# Patient Record
Sex: Female | Born: 1965
Health system: Southern US, Community
[De-identification: ages and names within clinical notes are randomized; demographics above are authoritative.]

---

## 2005-03-08 ENCOUNTER — Ambulatory Visit: Payer: Self-pay

## 2005-03-17 ENCOUNTER — Ambulatory Visit: Payer: Self-pay

## 2008-01-23 ENCOUNTER — Ambulatory Visit: Payer: Self-pay

## 2018-09-13 NOTE — Progress Notes (Signed)
Patient, No Pcp Per   Chief Complaint  Patient presents with  . Metrorrhagia    sometimes goes 2 months without it, this last time started period before fathers day in June and has not stopped bleeding since then, lots of clotting , no abnormal pain    HPI:      Ms. Cassie Cervantes is a 53 y.o. No obstetric history on file. who LMP was Patient's last menstrual period was 07/22/2018 (approximate)., presents today for NP menstrual issues of AUB since 6/20. Pt usually with menses Q1-3 months, lasting 4-7 days, light to mod flow, no clots, rare dysmen, no BTB. Feels hot at night sometimes but no sweating or hot flashes. Bleeding started 6/20 after 2-3 months of no menses. Has been daily since, mod to heavy flow, with small to larger clots, still without dysmen/pelvic pain. Flow has decreased some in last couple of days. Pt is sex active, no dyspareunia/postcoital bleeding. No vag sx. No recent labs done. Pt has not had recent pap/annual/mammo. No hx of abn paps. No FH breast/ovar/uterine/colon cancer.  No prior colonoscopy.  No med hx.   History reviewed. No pertinent past medical history.  History reviewed. No pertinent surgical history.  Family History  Problem Relation Age of Onset  . Lung disease Mother   . Heart disease Mother   . Lung cancer Father [id number]    Social History   Socioeconomic History  . Marital status: Married    Spouse name: Not on file  . Number of children: Not on file  . Years of education: Not on file  . Highest education level: Not on file  Occupational History  . Not on file  Social Needs  . Financial resource strain: Not on file  . Food insecurity    Worry: Not on file    Inability: Not on file  . Transportation needs    Medical: Not on file    Non-medical: Not on file  Tobacco Use  . Smoking status: Current Every Day Smoker  . Smokeless tobacco: Never Used  Substance and Sexual Activity  . Alcohol use: Yes  . Drug use: Not Currently  . Sexual  activity: Not Currently  Lifestyle  . Physical activity    Days per week: Not on file    Minutes per session: Not on file  . Stress: Not on file  Relationships  . Social Musicianconnections    Talks on phone: Not on file    Gets together: Not on file    Attends religious service: Not on file    Active member of club or organization: Not on file    Attends meetings of clubs or organizations: Not on file    Relationship status: Not on file  . Intimate partner violence    Fear of current or ex partner: Not on file    Emotionally abused: Not on file    Physically abused: Not on file    Forced sexual activity: Not on file  Other Topics Concern  . Not on file  Social History Narrative  . Not on file    No outpatient medications prior to visit.   No facility-administered medications prior to visit.       ROS:  Review of Systems  Constitutional: Positive for fatigue. Negative for fever and unexpected weight change.  Respiratory: Negative for cough, shortness of breath and wheezing.   Cardiovascular: Negative for chest pain, palpitations and leg swelling.  Gastrointestinal: Negative for blood in stool, constipation,  diarrhea, nausea and vomiting.  Endocrine: Negative for cold intolerance, heat intolerance and polyuria.  Genitourinary: Positive for menstrual problem. Negative for dyspareunia, dysuria, flank pain, frequency, genital sores, hematuria, pelvic pain, urgency, vaginal bleeding, vaginal discharge and vaginal pain.  Musculoskeletal: Negative for back pain, joint swelling and myalgias.  Skin: Negative for rash.  Neurological: Negative for dizziness, syncope, light-headedness, numbness and headaches.  Hematological: Negative for adenopathy.  Psychiatric/Behavioral: Negative for agitation, confusion, sleep disturbance and suicidal ideas. The patient is not nervous/anxious.   BREAST: No symptoms   OBJECTIVE:   Vitals:  BP 126/80   Ht 5\' 3"  (1.6 m)   Wt 174 lb (78.9 kg)   LMP  07/22/2018 (Approximate)   BMI 30.82 kg/m   Physical Exam Vitals signs reviewed.  Constitutional:      Appearance: She is well-developed.  Neck:     Musculoskeletal: Normal range of motion.  Pulmonary:     Effort: Pulmonary effort is normal.  Genitourinary:    Pubic Area: No rash.      Labia:        Right: Rash present. No tenderness or lesion.        Left: Rash present. No tenderness or lesion.      Vagina: Bleeding present. No vaginal discharge, erythema or tenderness.     Cervix: No friability, lesion or erythema.     Uterus: Normal. Not enlarged and not tender.      Adnexa: Right adnexa normal and left adnexa normal.       Right: No mass or tenderness.         Left: No mass or tenderness.       Comments: ERYTHEMA BILAT LABIA MAJORA (MOST LIKELY DUE TO RECENT BATHING SUIT); NO ITCH/IRRITATION TO PT Musculoskeletal: Normal range of motion.  Skin:    General: Skin is warm and dry.  Neurological:     General: No focal deficit present.     Mental Status: She is alert and oriented to person, place, and time.  Psychiatric:        Mood and Affect: Mood normal.        Behavior: Behavior normal.        Thought Content: Thought content normal.        Judgment: Judgment normal.     Assessment/Plan: Abnormal uterine bleeding (AUB) - Plan: TSH + free T4, Prolactin, US PELVIS TRANSVAGINAL NON-OB (TV ONLY),  Since 6/20. Flow is lighter past few days. Check labs, pap, and u/s. Will f/u with results. Most likely perimenopausal. May need EMB and/or hormones.  Thyroid disorder screening - Plan: TSH + free T4,  Cervical cancer screening - Plan: Cytology - PAP,   Screening for HPV (human papillomavirus) - Plan: Cytology - PAP,     Return in about 1 day (around 09/17/2018) for GYN u/s for AUB--[facility] to call pt. Aletha Halim have pt to RTO for annual once this addressed  Alicia B. Copland, PA-C 09/16/2018 10:16 AM

## 2018-09-13 NOTE — Patient Instructions (Signed)
I value your feedback and entrusting us with your care. If you get a [facility] patient survey, I would appreciate you taking the time to let us know about your experience today. Thank you! 

## 2018-09-16 ENCOUNTER — Encounter: Payer: Self-pay | Admitting: Obstetrics and Gynecology

## 2018-09-16 ENCOUNTER — Other Ambulatory Visit: Payer: Self-pay

## 2018-09-16 ENCOUNTER — Other Ambulatory Visit (HOSPITAL_COMMUNITY)
Admission: RE | Admit: 2018-09-16 | Discharge: 2018-09-16 | Disposition: A | Discharge status: Home / Self Care | Payer: Commercial Managed Care - PPO | Source: Ambulatory Visit | Attending: Obstetrics and Gynecology | Admitting: Obstetrics and Gynecology

## 2018-09-16 ENCOUNTER — Ambulatory Visit (INDEPENDENT_AMBULATORY_CARE_PROVIDER_SITE_OTHER): Payer: Commercial Managed Care - PPO | Admitting: Obstetrics and Gynecology

## 2018-09-16 VITALS — BP 126/80 | Ht 63.0 in | Wt 174.0 lb

## 2018-09-16 DIAGNOSIS — Z1329 Encounter for screening for other suspected endocrine disorder: Secondary | ICD-10-CM

## 2018-09-16 DIAGNOSIS — Z1151 Encounter for screening for human papillomavirus (HPV): Secondary | ICD-10-CM | POA: Insufficient documentation

## 2018-09-16 DIAGNOSIS — N939 Abnormal uterine and vaginal bleeding, unspecified: Secondary | ICD-10-CM | POA: Diagnosis not present

## 2018-09-16 DIAGNOSIS — Z124 Encounter for screening for malignant neoplasm of cervix: Secondary | ICD-10-CM | POA: Diagnosis not present

## 2018-09-17 ENCOUNTER — Other Ambulatory Visit: Payer: Commercial Managed Care - PPO

## 2018-09-17 LAB — PROLACTIN: Prolactin: 7.1 ng/mL (ref 4.8–23.3)

## 2018-09-17 LAB — TSH+FREE T4
Free T4: 1.13 ng/dL (ref 0.82–1.77)
TSH: 3.38 u[IU]/mL (ref 0.450–4.500)

## 2018-09-17 NOTE — Progress Notes (Signed)
Pls let pt know labs normal. Will wait for u/s results.

## 2018-09-17 NOTE — Progress Notes (Signed)
Pt aware.

## 2018-09-18 LAB — CYTOLOGY - PAP
Diagnosis: NEGATIVE
HPV: NOT DETECTED

## 2018-09-20 ENCOUNTER — Ambulatory Visit (INDEPENDENT_AMBULATORY_CARE_PROVIDER_SITE_OTHER): Payer: Commercial Managed Care - PPO

## 2018-09-20 ENCOUNTER — Other Ambulatory Visit: Payer: Self-pay

## 2018-09-20 DIAGNOSIS — N939 Abnormal uterine and vaginal bleeding, unspecified: Secondary | ICD-10-CM

## 2018-09-23 ENCOUNTER — Telehealth: Payer: Self-pay | Admitting: Obstetrics and Gynecology

## 2018-09-23 DIAGNOSIS — N83201 Unspecified ovarian cyst, right side: Secondary | ICD-10-CM

## 2018-09-23 NOTE — Telephone Encounter (Signed)
Pt aware of GYN u/s results. Was seen for AUB since 6/20. Bleeding has since stopped. Will follow cycles to see if sx persist and f/u for EMB if sx recur.  Pt aware of RTO complex cyst. No pelvic pain. RTO in 3 months for f/u u/s. Pt to call to sched.   ULTRASOUND REPORT  Location: [facility]  Date of Service: 09/20/2018     Indications:Abnormal Uterine Bleeding Findings:  The uterus is anteverted and measures 9.1 x 6.2 x 4.8 cm. Echo texture is heterogenous without evidence of focal masses.  The Endometrium measures 5.7 mm. The endometrium is not clearly visualized.   Right Ovary measures 2.6 x 1.6 x 1.7 cm. It is not normal in appearance. There is a complex cyst in the right ovary, no blood flow. It measures 16 x 12 x 10 mm.  Left Ovary measures 2.4 x 1.5 x 1.2  cm. It is normal in appearance. Survey of the adnexa demonstrates no adnexal masses. There is no free fluid in the cul de sac.  Impression: 1. The uterus is heterogeneous.  2. The endometrium is not clearly visualized.  3. There is a small, complex cyst in the right ovary. No blood flow is seen within.  4. Normal right ovary.   Recommendations: 1.Clinical correlation with the patient's History and Physical Exam.  Gweneth Dimitri, RT   Review of ULTRASOUND.    I have personally reviewed images and report of recent ultrasound done at [facility].    Plan of management to be discussed with patient.  Barnett Applebaum, MD, [facility], [facility] 09/21/2018  2:08 PM

## 2018-10-24 ENCOUNTER — Other Ambulatory Visit: Payer: Self-pay | Admitting: Physician Assistant

## 2018-10-24 DIAGNOSIS — Z1231 Encounter for screening mammogram for malignant neoplasm of breast: Secondary | ICD-10-CM

## 2018-11-06 ENCOUNTER — Ambulatory Visit
Admission: RE | Admit: 2018-11-06 | Discharge: 2018-11-06 | Disposition: A | Discharge status: Home / Self Care | Payer: Commercial Managed Care - PPO | Source: Ambulatory Visit | Attending: Physician Assistant | Admitting: Physician Assistant

## 2018-11-06 ENCOUNTER — Other Ambulatory Visit: Payer: Self-pay

## 2018-11-06 DIAGNOSIS — Z1231 Encounter for screening mammogram for malignant neoplasm of breast: Secondary | ICD-10-CM | POA: Diagnosis present

## 2018-11-29 ENCOUNTER — Other Ambulatory Visit: Payer: Self-pay | Admitting: Physician Assistant

## 2018-11-29 ENCOUNTER — Other Ambulatory Visit (HOSPITAL_COMMUNITY): Payer: Self-pay | Admitting: Physician Assistant

## 2018-11-29 DIAGNOSIS — R222 Localized swelling, mass and lump, trunk: Secondary | ICD-10-CM

## 2018-12-10 ENCOUNTER — Other Ambulatory Visit: Payer: Self-pay

## 2018-12-10 ENCOUNTER — Ambulatory Visit
Admission: RE | Admit: 2018-12-10 | Discharge: 2018-12-10 | Disposition: A | Discharge status: Home / Self Care | Payer: Commercial Managed Care - PPO | Source: Ambulatory Visit | Attending: Physician Assistant | Admitting: Physician Assistant

## 2018-12-10 DIAGNOSIS — R222 Localized swelling, mass and lump, trunk: Secondary | ICD-10-CM | POA: Insufficient documentation

## 2021-05-07 IMAGING — MG MM DIGITAL SCREENING BILAT W/ TOMO W/ CAD
8 series · 8 of 24 positions shown · non-contrast
Comparison: Previous exam(s).

CLINICAL DATA: Screening.

EXAM:
DIGITAL SCREENING BILATERAL MAMMOGRAM WITH TOMO AND CAD

[R CC synth-2D]
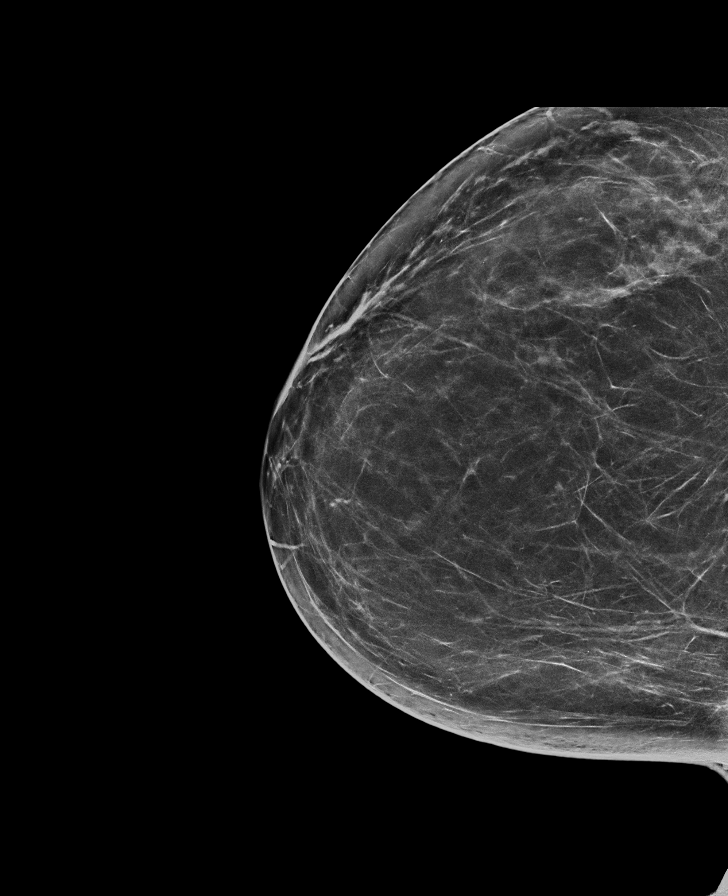

[L CC synth-2D]
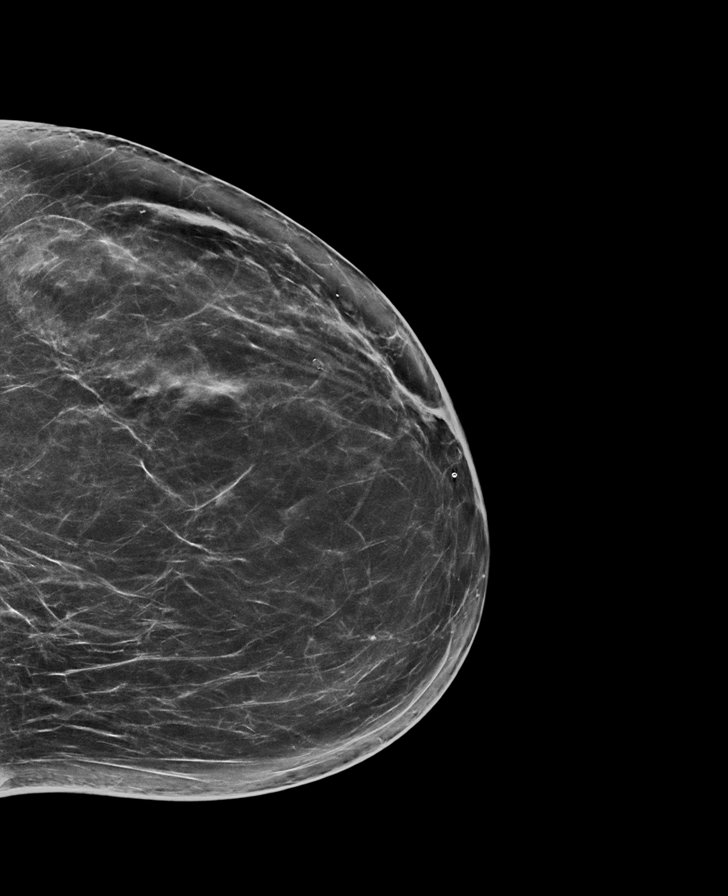

[L MLO synth-2D]
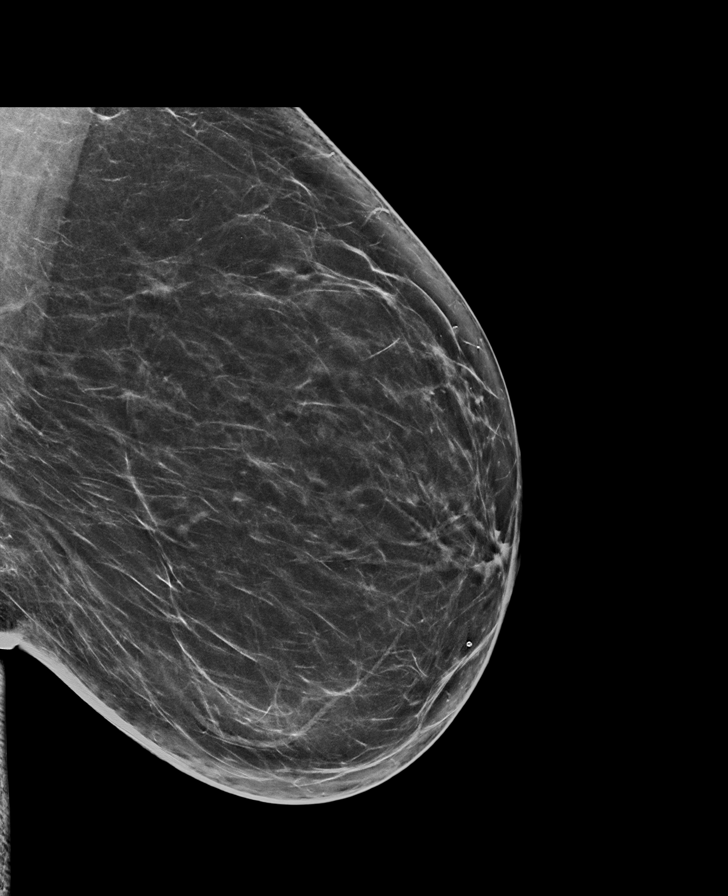

[R MLO synth-2D]
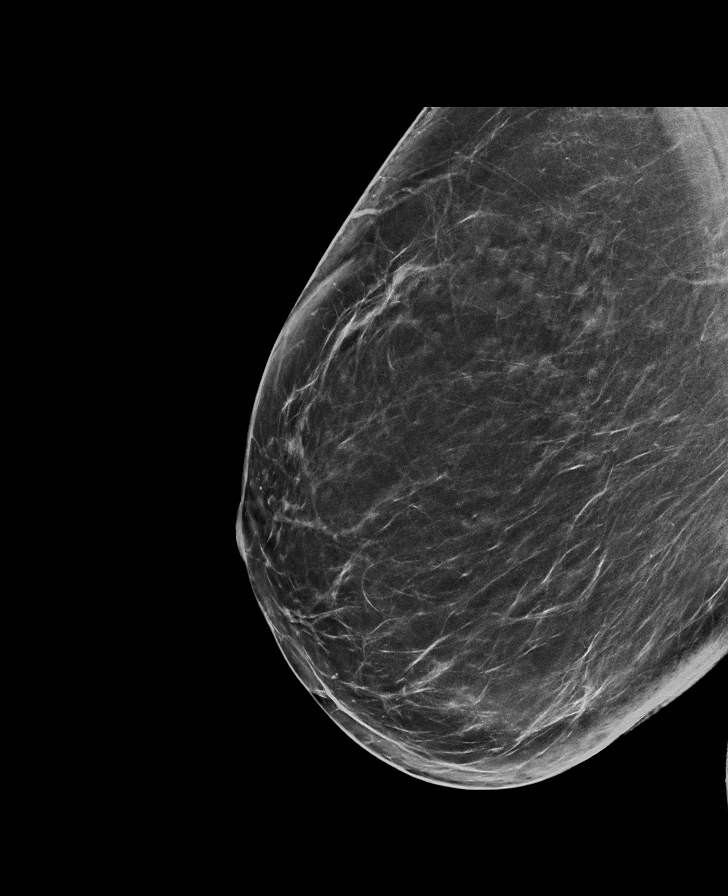

[R CC tomo · tomo slice 40/79.0]
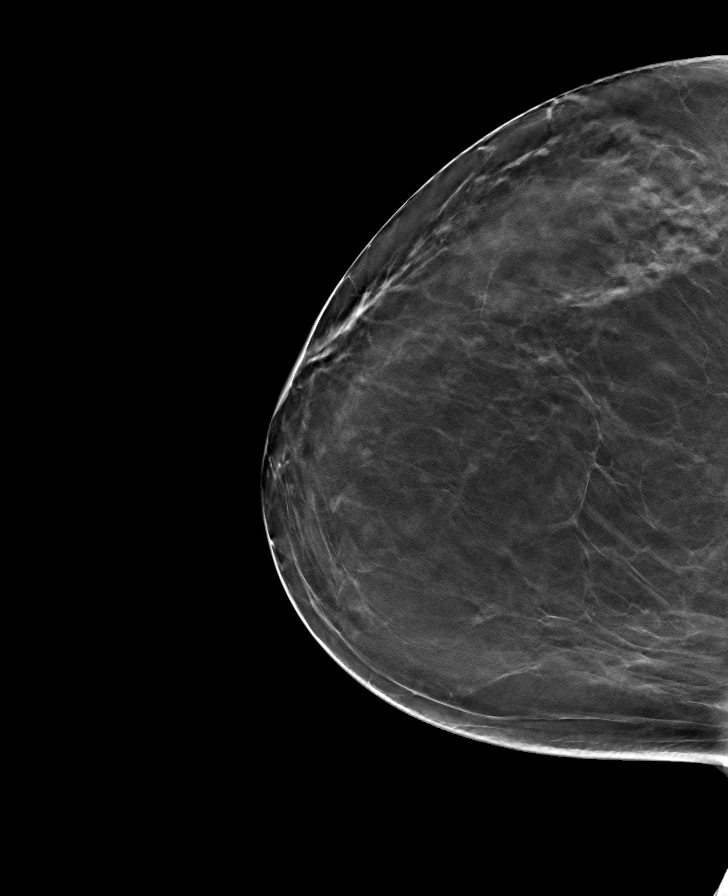

[R MLO tomo · tomo slice 43/84.0]
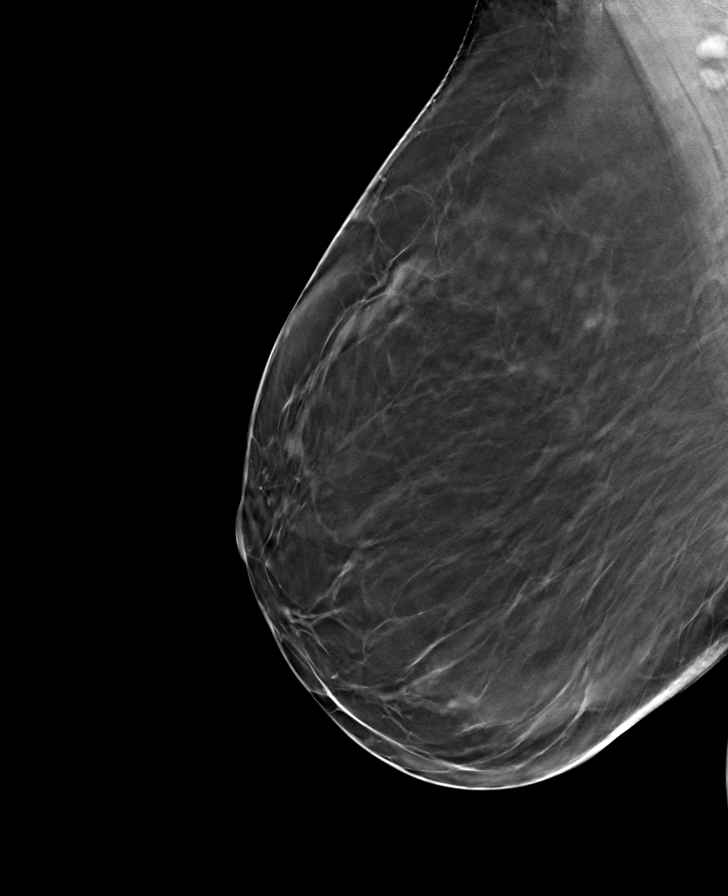

[L MLO tomo · tomo slice 43/85.0]
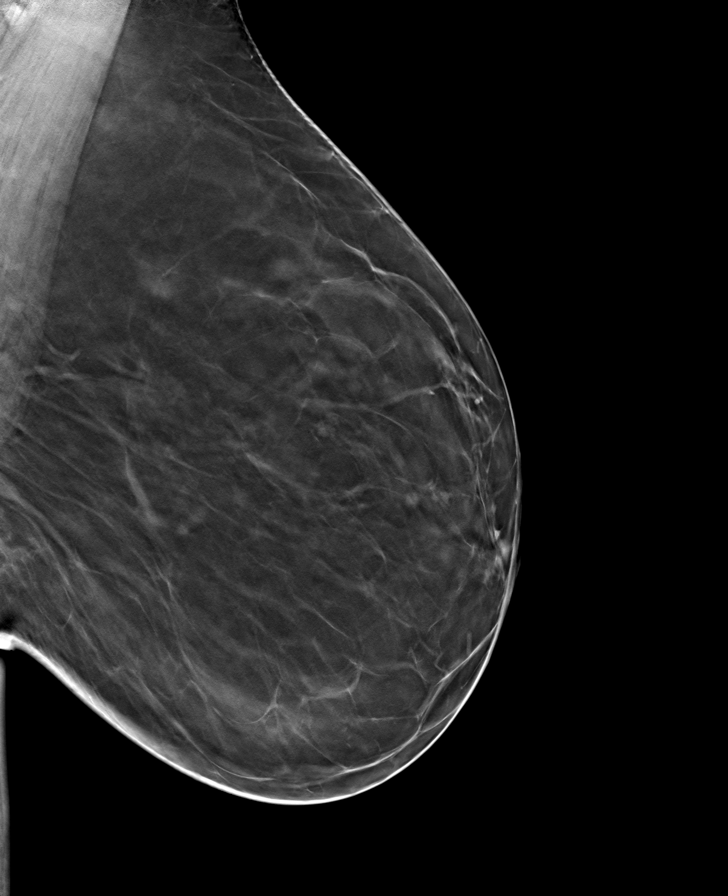

[L CC tomo · tomo slice 41/82.0]
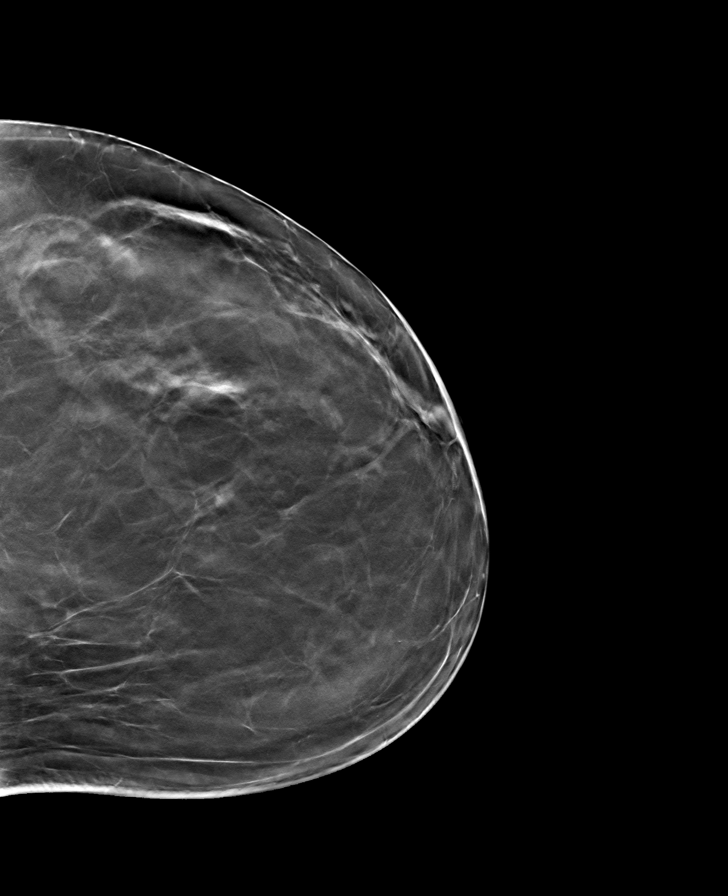

[8 of 24 positions shown; findings below may reference images not displayed]

ACR Breast Density Category b: There are scattered areas of
fibroglandular density.
FINDINGS: There are no findings suspicious for malignancy. Images were
processed with CAD.
IMPRESSION: No mammographic evidence of malignancy. A result letter of this
screening mammogram will be mailed directly to the patient.

RECOMMENDATION:
Screening mammogram in one year. (Code:[id number])

BI-RADS CATEGORY  1: Negative.

## 2021-06-10 IMAGING — CT CT L SPINE W/O CM
3 of 4 series · 12 of 33 positions shown, 14 images · non-contrast
Comparison: None.

CLINICAL DATA: Low back pain and palpable mass of the back.

EXAM:
CT LUMBAR SPINE WITHOUT CONTRAST
TECHNIQUE: Multidetector CT imaging of the lumbar spine was performed without
intravenous contrast administration. Multiplanar CT image
reconstructions were also generated.

[Series 2: lspine st l-spine 2.00 · axial · 0.38mm/px · z∈[-1345,-1169]mm · 4 of 134 slices shown, 5 images]
[im 23/134  soft-tissue]
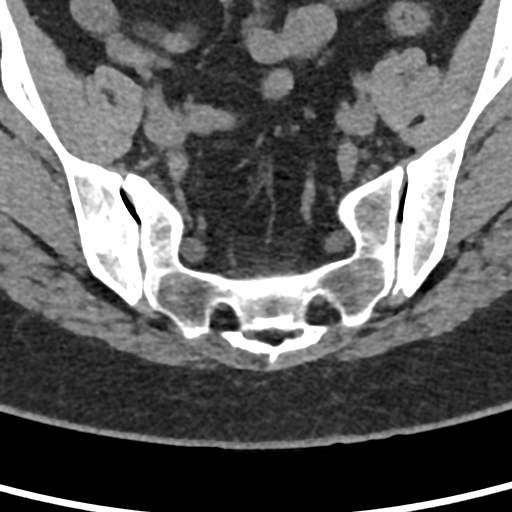
[im 23/134  bone]
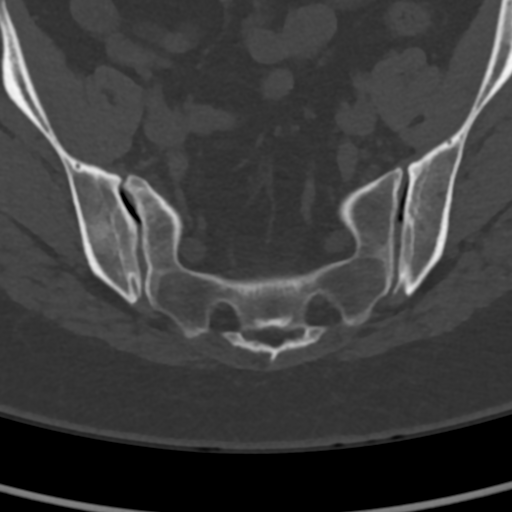
[im 45/134  bone]
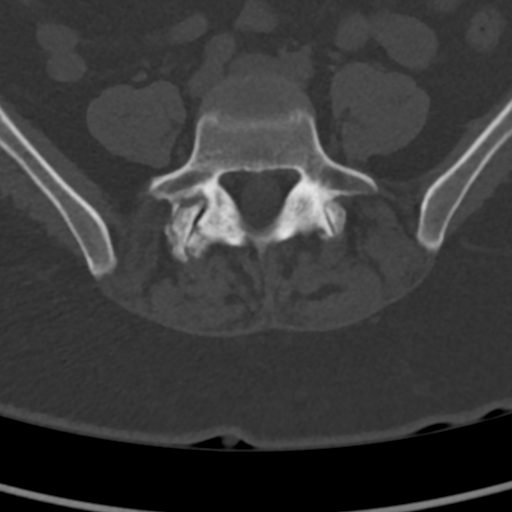
[im 89/134  bone]
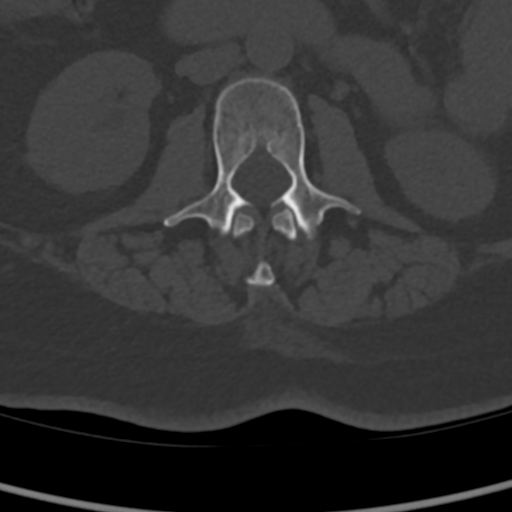
[im 111/134  bone]
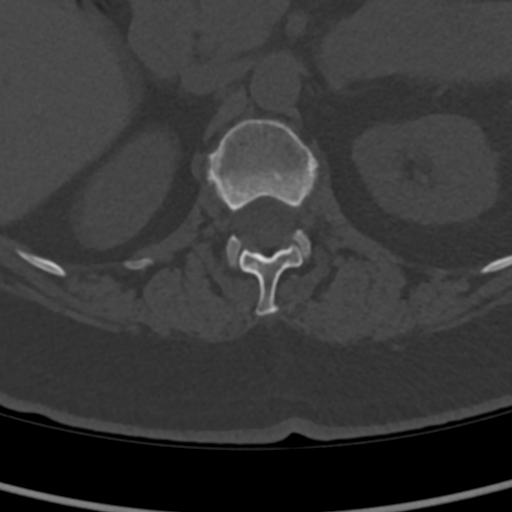

[Series 4: sag bone l-spine 2.00 sag · sagittal · 0.35mm/px · 5 of 96 slices shown, 6 images]
[im 32/96  bone]
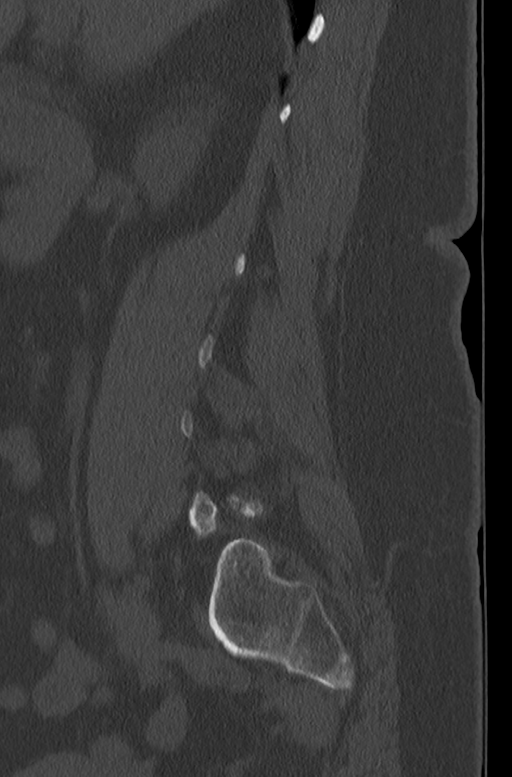
[im 40/96  bone]
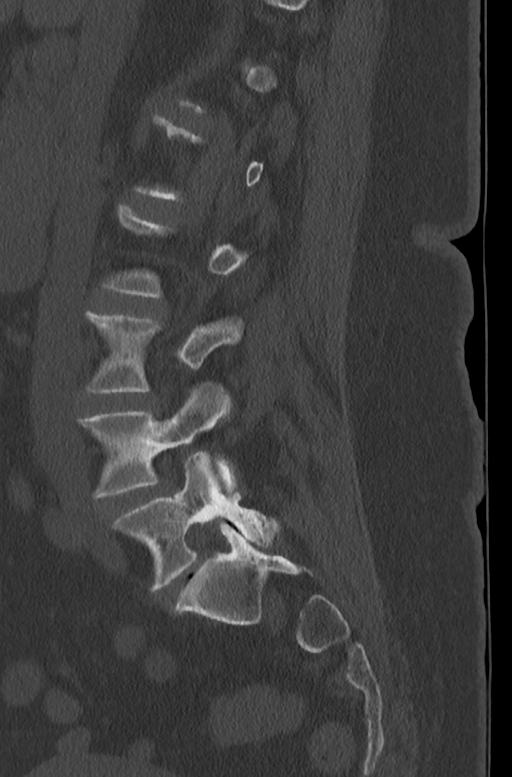
[im 48/96  soft-tissue]
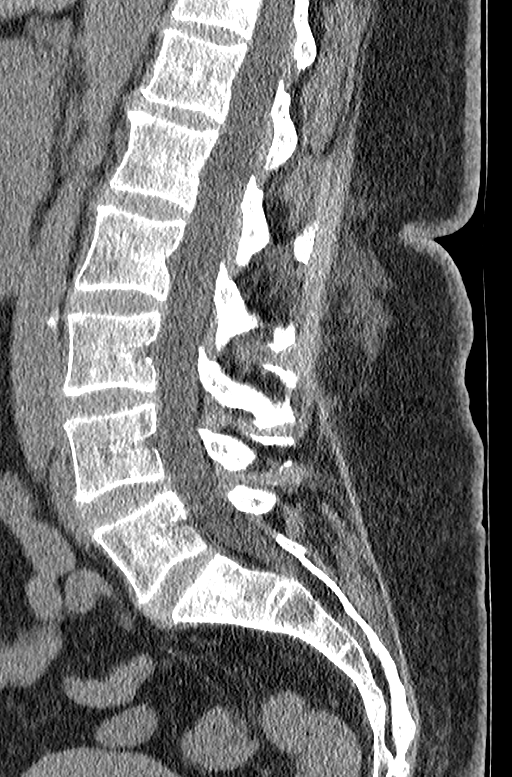
[im 48/96  bone]
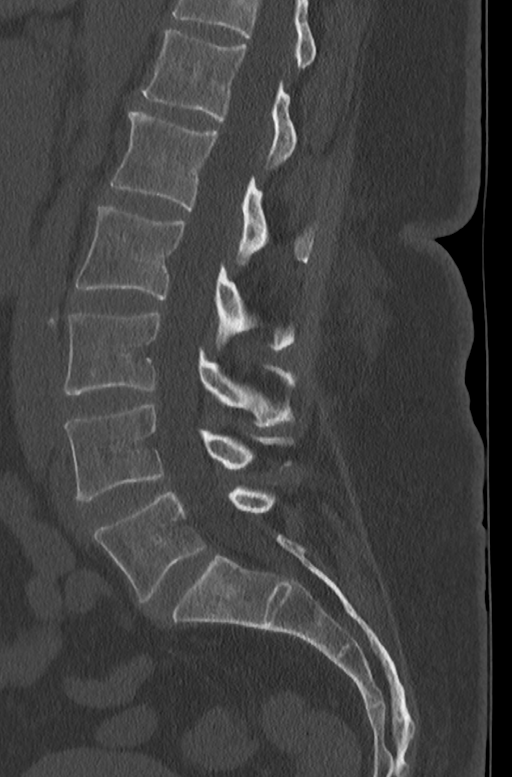
[im 56/96  bone]
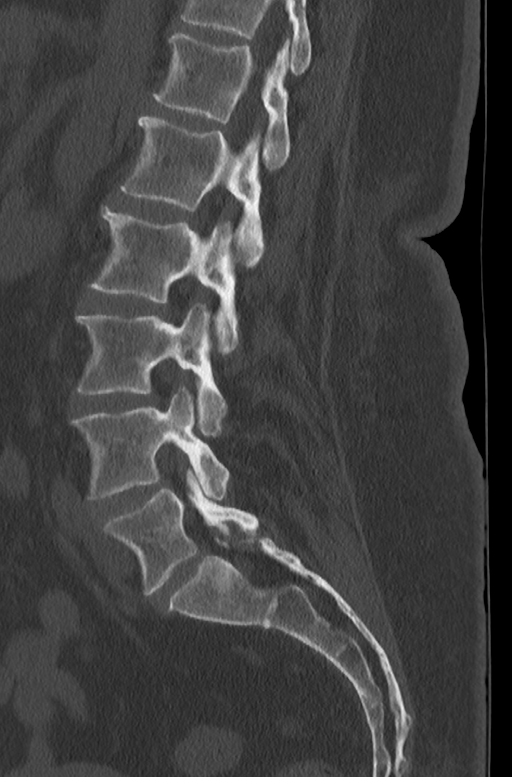
[im 64/96  bone]
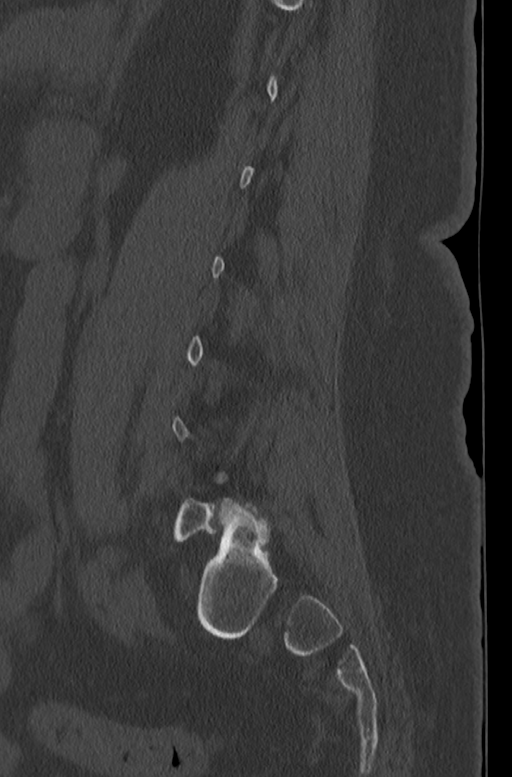

[Series 6: cor bone l-spine 2.00 cor · coronal · 0.38mm/px · 3 of 87 slices shown]
[im 18/87  bone]
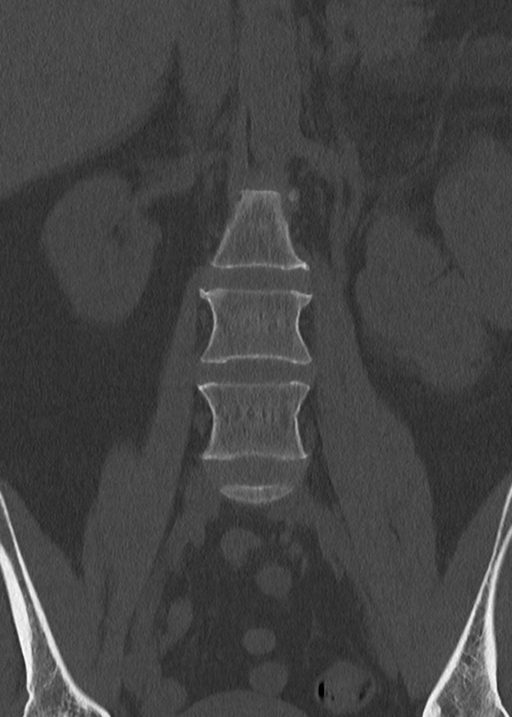
[im 35/87  bone]
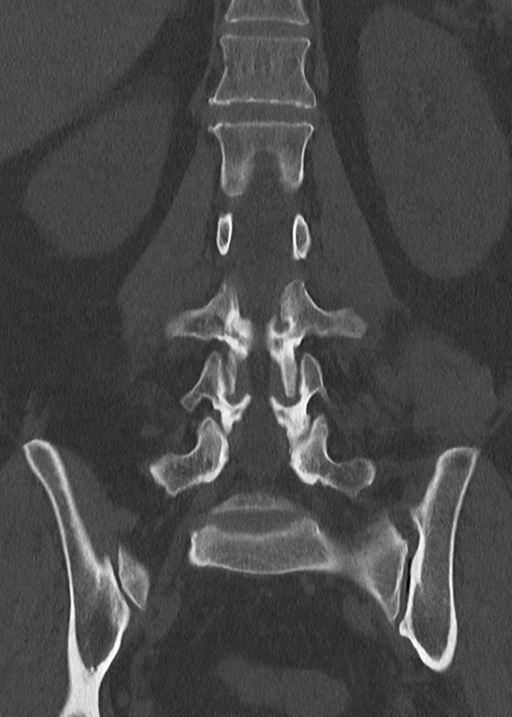
[im 52/87  bone]
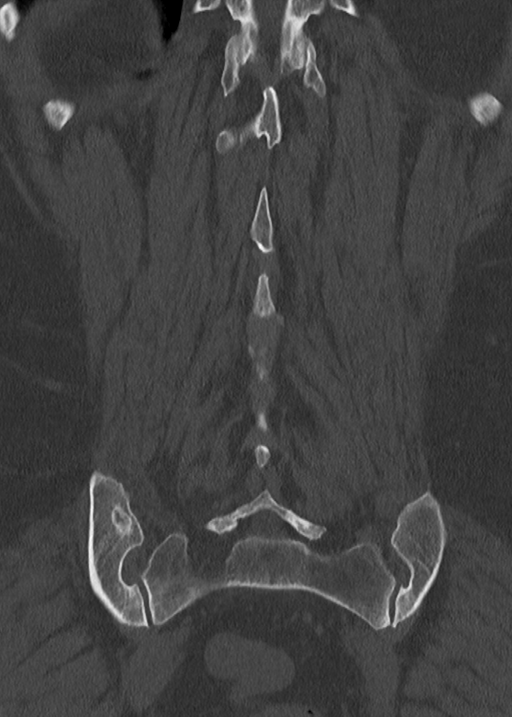

[12 of 33 positions shown; findings below may reference images not displayed]

FINDINGS: Segmentation: 5 lumbar type vertebrae.

Alignment: Normal.

Vertebrae: There is no fracture or mass lesion of the spine. There
is slight bilateral facet arthritis at L4-5. There is severe right
and moderate left facet arthritis at L5-S1.

Paraspinal and other soft tissues: There are few diverticula in the
distal colon. Slight aortic atherosclerosis. The patient reports a
palpable soft tissue mass in the lower aspect of the back just to
the right of midline at L5. There is no definable soft tissue mass,
cyst, or other significant abnormality in that area. The
subcutaneous fat appears slightly more prominent in that area.

Disc levels: The discs from T10-11 through L5-S1 are normal. No disc
bulging or protrusion. No disc space narrowing. No spinal or
foraminal stenosis.
IMPRESSION: 1. No significant abnormality of the lumbar spine. The patient
reports a palpable soft tissue mass in the lower aspect of the back
just to the right of midline at L5 but in the area of concern is
only prominent but otherwise normal subcutaneous fat.
2. Severe right facet arthritis at L5-S1.
3. Slight aortic atherosclerosis.

Aortic Atherosclerosis (BD3ES-AR9.9).

## 2023-01-17 ENCOUNTER — Ambulatory Visit
Admission: EM | Admit: 2023-01-17 | Discharge: 2023-01-17 | Disposition: A | Discharge status: Home / Self Care | Payer: Commercial Managed Care - PPO | Attending: Internal Medicine | Admitting: Internal Medicine

## 2023-01-17 DIAGNOSIS — H66002 Acute suppurative otitis media without spontaneous rupture of ear drum, left ear: Secondary | ICD-10-CM | POA: Diagnosis not present

## 2023-01-17 DIAGNOSIS — H6993 Unspecified Eustachian tube disorder, bilateral: Secondary | ICD-10-CM | POA: Diagnosis not present

## 2023-01-17 DIAGNOSIS — H9312 Tinnitus, left ear: Secondary | ICD-10-CM

## 2023-01-17 MED ORDER — AMOXICILLIN 875 MG PO TABS: 875.0000 mg | ORAL_TABLET | Freq: Two times a day (BID) | ORAL | 0 refills | Status: AC

## 2023-01-17 MED ORDER — PREDNISONE 20 MG PO TABS: 40.0000 mg | ORAL_TABLET | Freq: Every day | ORAL | 0 refills | Status: AC

## 2023-01-17 MED ORDER — FLUTICASONE PROPIONATE 50 MCG/ACT NA SUSP: 1.0000 | Freq: Every day | NASAL | 0 refills | Status: AC

## 2023-01-17 NOTE — ED Triage Notes (Addendum)
Patient states that she started with a sore throat Tuesday. She blew her nose Wednesday and her left ear popped. She still has a bussing noise in her ear. Chest congestion. Patient states that she feels that she had Covid because she lost her taste and smell that she has started getting back today. Throat is no longer sore. She just has mucus and her left ear popping.

## 2023-01-17 NOTE — ED Provider Notes (Signed)
[facility]    CSN: [id number] Arrival date & time: 01/17/23  1717      History   Chief Complaint Chief Complaint  Patient presents with   Ear Fullness   mucus    HPI Cassie Cervantes is a 57 y.o. female presents for ear pain.  Patient reports last week she developed some cough congestion and sore throat.  She reports a week ago today she blew her nose and she had a large pop in her left ear and since then has been having diminished hearing, fullness, and tinnitus.  She reports some residual nasal congestion but states her cough is better and she denies any fevers.  No asthma history.  She is an active smoker.  Denies any body aches, shortness of breath.  She has been taking Afrin OTC for symptoms.  No other concerns at this time   Ear Fullness    History reviewed. No pertinent past medical history.  There are no problems to display for this patient.   History reviewed. No pertinent surgical history.  OB History     Gravida  3   Para  2   Term  2   Preterm      AB  1   Living  2      SAB      IAB      Ectopic      Multiple      Live Births  2            Home Medications    Prior to Admission medications   Medication Sig Start Date End Date Taking? Authorizing Provider  amoxicillin (AMOXIL) 875 MG tablet Take 1 tablet (875 mg total) by mouth 2 (two) times daily for 10 days. 01/17/23 01/27/23 Yes Radford Pax, NP  fluticasone (FLONASE) 50 MCG/ACT nasal spray Place 1 spray into both nostrils daily. 01/17/23  Yes Radford Pax, NP  predniSONE (DELTASONE) 20 MG tablet Take 2 tablets (40 mg total) by mouth daily with breakfast for 5 days. 01/18/23 01/23/23 Yes Radford Pax, NP    Family History Family History  Problem Relation Age of Onset   Lung disease Mother    Heart disease Mother    Lung cancer Father 61   Breast cancer Neg Hx     Social History Social History   Tobacco Use   Smoking status: Every Day   Smokeless  tobacco: Never  Vaping Use   Vaping status: Never Used  Substance Use Topics   Alcohol use: Yes   Drug use: Not Currently     Allergies   Patient has no known allergies.   Review of Systems Review of Systems  HENT:  Positive for congestion, ear pain and tinnitus.      Physical Exam Triage Vital Signs ED Triage Vitals  Encounter Vitals Group     BP 01/17/23 1757 (!) 187/96     Systolic BP Percentile --      Diastolic BP Percentile --      Pulse Rate 01/17/23 1757 68     Resp 01/17/23 1757 19     Temp 01/17/23 1757 98.2 F (36.8 C)     Temp Source 01/17/23 1757 Oral     SpO2 01/17/23 1757 94 %     Weight --      Height --      Head Circumference --      Peak Flow --      Pain Score 01/17/23  1756 0     Pain Loc --      Pain Education --      Exclude from Growth Chart --    No data found.  Updated Vital Signs BP (!) 187/96 (BP Location: Right Arm)   Pulse 68   Temp 98.2 F (36.8 C) (Oral)   Resp 19   LMP 07/08/2018 (Approximate)   SpO2 94%   Visual Acuity Right Eye Distance:   Left Eye Distance:   Bilateral Distance:    Right Eye Near:   Left Eye Near:    Bilateral Near:     Physical Exam Vitals and nursing note reviewed.  Constitutional:      General: She is not in acute distress.    Appearance: She is well-developed. She is not ill-appearing.  HENT:     Head: Normocephalic and atraumatic.     Right Ear: Ear canal normal. A middle ear effusion is present.     Left Ear: Ear canal normal. Tympanic membrane is erythematous. Tympanic membrane is not perforated.     Nose: Congestion present.     Mouth/Throat:     Mouth: Mucous membranes are moist.     Pharynx: Oropharynx is clear. Uvula midline. No oropharyngeal exudate or posterior oropharyngeal erythema.     Tonsils: No tonsillar exudate or tonsillar abscesses.  Eyes:     Conjunctiva/sclera: Conjunctivae normal.     Pupils: Pupils are equal, round, and reactive to light.  Cardiovascular:      Rate and Rhythm: Normal rate and regular rhythm.     Heart sounds: Normal heart sounds.  Pulmonary:     Effort: Pulmonary effort is normal.     Breath sounds: Normal breath sounds.  Musculoskeletal:     Cervical back: Normal range of motion and neck supple.  Lymphadenopathy:     Cervical: No cervical adenopathy.  Skin:    General: Skin is warm and dry.  Neurological:     General: No focal deficit present.     Mental Status: She is alert and oriented to person, place, and time.  Psychiatric:        Mood and Affect: Mood normal.        Behavior: Behavior normal.      UC Treatments / Results  Labs (all labs ordered are listed, but only abnormal results are displayed) Labs Reviewed - No data to display  EKG   Radiology No results found.  Procedures Procedures (including critical care time)  Medications Ordered in UC Medications - No data to display  Initial Impression / Assessment and Plan / UC Course  I have reviewed the triage vital signs and the nursing notes.  Pertinent labs & imaging results that were available during my care of the patient were reviewed by me and considered in my medical decision making (see chart for details).     Reviewed exam and symptoms with patient.  No red flags.  Will start amoxicillin for left OM.  Start Flonase and prednisone as well for eustachian tube dysfunction.  Discussed tinnitus likely secondary to this.  Advised PCP or ENT follow-up as symptoms do not improve.  She declined cough medicine stating her cough is better and declined chest x-ray at this time.  ER precautions reviewed. Final Clinical Impressions(s) / UC Diagnoses   Final diagnoses:  Non-recurrent acute suppurative otitis media of left ear without spontaneous rupture of tympanic membrane  Tinnitus of left ear  Eustachian tube dysfunction, bilateral     Discharge  Instructions      Start amoxicillin twice daily for 10 days.  Flonase daily.  Prednisone daily for 5  days to help with your eustachian tube dysfunction.  Lots of rest and fluids.  Please follow-up with your PCP if your symptoms do not improve.  Please go to the ER for any worsening symptoms.  Hope you feel better soon!    ED Prescriptions     Medication Sig Dispense Auth. Provider   amoxicillin (AMOXIL) 875 MG tablet Take 1 tablet (875 mg total) by mouth 2 (two) times daily for 10 days. 20 tablet Radford Pax, NP   fluticasone (FLONASE) 50 MCG/ACT nasal spray Place 1 spray into both nostrils daily. 15.8 mL Radford Pax, NP   predniSONE (DELTASONE) 20 MG tablet Take 2 tablets (40 mg total) by mouth daily with breakfast for 5 days. 10 tablet Radford Pax, NP      PDMP not reviewed this encounter.   Radford Pax, NP 01/17/23 [phone]

## 2023-01-17 NOTE — Discharge Instructions (Addendum)
Start amoxicillin twice daily for 10 days.  Flonase daily.  Prednisone daily for 5 days to help with your eustachian tube dysfunction.  Lots of rest and fluids.  Please follow-up with your PCP if your symptoms do not improve.  Please go to the ER for any worsening symptoms.  Hope you feel better soon!
